# Patient Record
Sex: Male | Born: 1980 | Hispanic: Yes | Marital: Married | Smoking: Current every day smoker
Health system: Southern US, Community
[De-identification: ages and names within clinical notes are randomized; demographics above are authoritative.]

---

## 2016-03-07 ENCOUNTER — Encounter: Payer: Self-pay | Admitting: Emergency Medicine

## 2016-03-07 DIAGNOSIS — F17200 Nicotine dependence, unspecified, uncomplicated: Secondary | ICD-10-CM | POA: Insufficient documentation

## 2016-03-07 DIAGNOSIS — K219 Gastro-esophageal reflux disease without esophagitis: Secondary | ICD-10-CM | POA: Insufficient documentation

## 2016-03-07 LAB — URINALYSIS COMPLETE WITH MICROSCOPIC (ARMC ONLY)
BACTERIA UA: NONE SEEN
Bilirubin Urine: NEGATIVE
GLUCOSE, UA: NEGATIVE mg/dL
Hgb urine dipstick: NEGATIVE
Ketones, ur: NEGATIVE mg/dL
Leukocytes, UA: NEGATIVE
Nitrite: NEGATIVE
PROTEIN: NEGATIVE mg/dL
SQUAMOUS EPITHELIAL / LPF: NONE SEEN
Specific Gravity, Urine: 1.017 (ref 1.005–1.030)
WBC UA: NONE SEEN WBC/hpf (ref 0–5)
pH: 6 (ref 5.0–8.0)

## 2016-03-07 LAB — COMPREHENSIVE METABOLIC PANEL
ALBUMIN: 4.4 g/dL (ref 3.5–5.0)
ALT: 53 U/L (ref 17–63)
AST: 43 U/L — AB (ref 15–41)
Alkaline Phosphatase: 56 U/L (ref 38–126)
Anion gap: 10 (ref 5–15)
BILIRUBIN TOTAL: 0.4 mg/dL (ref 0.3–1.2)
BUN: 12 mg/dL (ref 6–20)
CHLORIDE: 106 mmol/L (ref 101–111)
CO2: 22 mmol/L (ref 22–32)
Calcium: 9 mg/dL (ref 8.9–10.3)
Creatinine, Ser: 0.89 mg/dL (ref 0.61–1.24)
GFR calc Af Amer: >60 mL/min (ref >60)
GFR calc non Af Amer: >60 mL/min (ref >60)
GLUCOSE: 145 mg/dL — AB (ref 65–99)
POTASSIUM: 3.3 mmol/L — AB (ref 3.5–5.1)
SODIUM: 138 mmol/L (ref 135–145)
Total Protein: 7.3 g/dL (ref 6.5–8.1)

## 2016-03-07 LAB — CBC
HEMATOCRIT: 46.7 % (ref 40.0–52.0)
HEMOGLOBIN: 16.5 g/dL (ref 13.0–18.0)
MCH: 33.7 pg (ref 26.0–34.0)
MCHC: 35.3 g/dL (ref 32.0–36.0)
MCV: 95.5 fL (ref 80.0–100.0)
Platelets: 146 10*3/uL — ABNORMAL LOW (ref 150–440)
RBC: 4.89 MIL/uL (ref 4.40–5.90)
RDW: 12.8 % (ref 11.5–14.5)
WBC: 5.8 10*3/uL (ref 3.8–10.6)

## 2016-03-07 LAB — TROPONIN I

## 2016-03-07 LAB — LIPASE, BLOOD: Lipase: 38 U/L (ref 11–51)

## 2016-03-07 NOTE — ED Triage Notes (Signed)
Patient ambulatory to triage with steady gait, without difficulty or distress noted; per [facility] interpreter, pt reports eating a sandwich then began having mid upper abd pain with SHOB and nausea, weakness

## 2016-03-08 ENCOUNTER — Emergency Department: Payer: Self-pay

## 2016-03-08 ENCOUNTER — Emergency Department
Admission: EM | Admit: 2016-03-08 | Discharge: 2016-03-08 | Disposition: A | Discharge status: Home / Self Care | Payer: Self-pay | Attending: Emergency Medicine | Admitting: Emergency Medicine

## 2016-03-08 DIAGNOSIS — R1010 Upper abdominal pain, unspecified: Secondary | ICD-10-CM

## 2016-03-08 DIAGNOSIS — K219 Gastro-esophageal reflux disease without esophagitis: Secondary | ICD-10-CM

## 2016-03-08 MED ORDER — ONDANSETRON 4 MG PO TBDP: ORAL_TABLET | ORAL | 0 refills | Status: AC

## 2016-03-08 MED ORDER — OMEPRAZOLE MAGNESIUM 20 MG PO TBEC: 20.0000 mg | DELAYED_RELEASE_TABLET | Freq: Every day | ORAL | 1 refills | Status: AC

## 2016-03-08 NOTE — ED Notes (Signed)
Interpreter used for discharge. 

## 2016-03-08 NOTE — ED Provider Notes (Signed)
[facility] Emergency Department Provider Note  ____________________________________________   First MD Initiated Contact with Patient 03/08/16 [phone]     (approximate)  I have reviewed the triage vital signs and the nursing notes.   HISTORY  Chief Complaint Abdominal Pain  The patient and/or family speak(s) Spanish.  They understand they have the right to the use of a hospital interpreter, however at this time they prefer to speak directly with me in Spanish.  They know that they can ask for an interpreter at any time.   HPI Philip Khan is a 35 y.o. male who reports no significant past medical history presents for evaluation of upper abdominal pain that occurred after eating.He reports this started acutely and was quite severe and caused him to be short of breath.  He also felt nauseated and flushed.  He did not vomit.  He denies fever/chills, chest pain, and lower abdominal pain.  He states that the same thing happened to him several days ago (he cannot remember exactly how long).  He feels much better now and is no longer having trouble breathing but still has some discomfort in his upper abdomen and it feels like acid going up into his throat.  Nothing particular made it better or worse.   History reviewed. No pertinent past medical history.  There are no active problems to display for this patient.   History reviewed. No pertinent surgical history.  Prior to Admission medications   Medication Sig Start Date End Date Taking? Authorizing Provider  omeprazole (PRILOSEC OTC) 20 MG tablet Take 1 tablet (20 mg total) by mouth daily. 03/08/16 03/08/17  Loleta Roseory Ladena Jacquez, MD  ondansetron (ZOFRAN ODT) 4 MG disintegrating tablet Allow 1-2 tablets to dissolve in your mouth every 8 hours as needed for nausea/vomiting 03/08/16   Loleta Roseory Suriyah Vergara, MD    Allergies Review of patient's allergies indicates no known allergies.  No family history on file.  Social  History Social History  Substance Use Topics  . Smoking status: Current Every Day Smoker  . Smokeless tobacco: Never Used  . Alcohol use Yes    Review of Systems Constitutional: No fever/chills Eyes: No visual changes. ENT: No sore throat. Cardiovascular: Denies chest pain. Respiratory: shortness of breath Due to the upper abdominal pain Gastrointestinal: +abdominal pain.  nausea, no vomiting.  No diarrhea.  No constipation. Genitourinary: Negative for dysuria. Musculoskeletal: Negative for back pain. Skin: Negative for rash. Neurological: Negative for headaches, focal weakness or numbness.  10-point ROS otherwise negative.  ____________________________________________   PHYSICAL EXAM:  VITAL SIGNS: ED Triage Vitals  Enc Vitals Group     BP 03/07/16 2309 (!) 157/96     Pulse Rate 03/07/16 2309 (!) 106     Resp 03/07/16 2309 20     Temp 03/07/16 2309 97.9 F (36.6 C)     Temp Source 03/07/16 2309 Oral     SpO2 03/07/16 2309 100 %     Weight 03/07/16 2309 183 lb (83 kg)     Height --      Head Circumference --      Peak Flow --      Pain Score 03/07/16 2308 5     Pain Loc --      Pain Edu? --      Excl. in GC? --     Constitutional: Alert and oriented. Well appearing and in no acute distress. Eyes: Conjunctivae are normal. PERRL. EOMI. Head: Atraumatic. Nose: No congestion/rhinnorhea. Mouth/Throat: Mucous membranes are moist.  Oropharynx non-erythematous. Neck: No stridor.  No meningeal signs.   Cardiovascular: Normal rate, regular rhythm. Good peripheral circulation. Grossly normal heart sounds.   Respiratory: Normal respiratory effort.  No retractions. Lungs CTAB. Gastrointestinal: Soft With mild tenderness to palpation of the epigastrium and tenderness in the right upper quadrant with positive Murphy sign.  No lower abdominal tenderness. Musculoskeletal: No lower extremity tenderness nor edema. No gross deformities of extremities. Neurologic:  Normal speech  and language. No gross focal neurologic deficits are appreciated.  Skin:  Skin is warm, dry and intact. No rash noted. Psychiatric: Mood and affect are normal. Speech and behavior are normal.  ____________________________________________   LABS (all labs ordered are listed, but only abnormal results are displayed)  Labs Reviewed  COMPREHENSIVE METABOLIC PANEL - Abnormal; Notable for the following:       Result Value   Potassium 3.3 (*)    Glucose, Bld 145 (*)    AST 43 (*)    All other components within normal limits  CBC - Abnormal; Notable for the following:    Platelets 146 (*)    All other components within normal limits  URINALYSIS COMPLETEWITH MICROSCOPIC ([facility] ONLY) - Abnormal; Notable for the following:    Color, Urine YELLOW (*)    APPearance CLEAR (*)    All other components within normal limits  LIPASE, BLOOD  TROPONIN I   ____________________________________________  EKG  ED ECG REPORT I, Zakariye Nee, the attending physician, personally viewed and interpreted this ECG.  Date: 03/07/2016 EKG Time: 23:11 Rate: 100 Rhythm: Sinus tachycardia QRS Axis: normal Intervals: normal ST/T Wave abnormalities: normal Conduction Disturbances: none Narrative Interpretation: unremarkable  ____________________________________________  RADIOLOGY   US Abdomen Limited Ruq  Result Date: 03/08/2016 CLINICAL DATA:  Postprandial epigastric and RIGHT upper quadrant pain. EXAM: US ABDOMEN LIMITED - RIGHT UPPER QUADRANT COMPARISON:  None. FINDINGS: Gallbladder: No gallstones or wall thickening visualized. No sonographic Murphy sign noted by sonographer. Common bile duct: Diameter: 3 mm Liver: Diffusely echogenic with focal fatty sparing about the gallbladder fossa. Hepatopetal portal vein. No intrahepatic biliary dilatation. IMPRESSION: No acute RIGHT upper quadrant process. Hepatic steatosis. Electronically Signed   By: Awilda Metro M.D.   On: 03/08/2016 04:54     ____________________________________________   PROCEDURES  Procedure(s) performed:   Procedures   Critical Care performed: No ____________________________________________   INITIAL IMPRESSION / ASSESSMENT AND PLAN / ED COURSE  Pertinent labs & imaging results that were available during my care of the patient were reviewed by me and considered in my medical decision making (see chart for details).  Very likely to be cardiac or pulmonary.  Acid reflux/GERD versus ileocolic is most likely.  Labs unremarkable and vital signs are normal, very mild tachycardia has resolved.  Patient is in no acute distress.  We will evaluate with ultrasound and reassess.  Clinical Course  Value Comment By Time  US Abdomen Limited RUQ Ultrasound is unremarkable with no evidence of gallbladder disease.  Patient is likely suffering from acid reflux.  I will recommend over-the-counter medications and outpatient follow-up.  I gave my usual and customary return precautions. Loleta Rose, MD 08/08 0510    ____________________________________________  FINAL CLINICAL IMPRESSION(S) / ED DIAGNOSES  Final diagnoses:  Pain of upper abdomen  Gastroesophageal reflux disease, esophagitis presence not specified     MEDICATIONS GIVEN DURING THIS VISIT:  Medications - No data to display   NEW OUTPATIENT MEDICATIONS STARTED DURING THIS VISIT:  Discharge Medication List as of 03/08/2016  5:32  AM    START taking these medications   Details  omeprazole (PRILOSEC OTC) 20 MG tablet Take 1 tablet (20 mg total) by mouth daily., Starting Tue 03/08/2016, Until Wed 03/08/2017, Print    ondansetron (ZOFRAN ODT) 4 MG disintegrating tablet Allow 1-2 tablets to dissolve in your mouth every 8 hours as needed for nausea/vomiting, Print          Note:  This document was prepared using Dragon voice recognition software and may include unintentional dictation errors.    Loleta Rose, MD 03/08/16 [phone]

## 2016-03-08 NOTE — ED Notes (Signed)
Pt oriented to room and call light.  Denies pain at this time; instead reports discomfort epigastric up through esophagus.  Denies nausea.

## 2018-08-03 IMAGING — US US ABDOMEN LIMITED
1 series · 14 of 25 positions shown · non-contrast
Comparison: None.

CLINICAL DATA: Postprandial epigastric and RIGHT upper quadrant
pain.

EXAM:
US ABDOMEN LIMITED - RIGHT UPPER QUADRANT

[Series 1: us abdomen limited · 0.20mm/px · 14 of 42 slices shown]
[im 1/42]
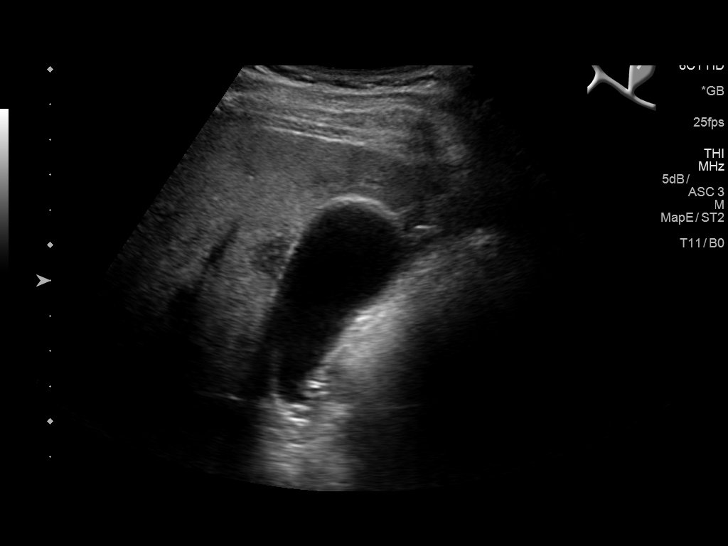
[im 4/42]
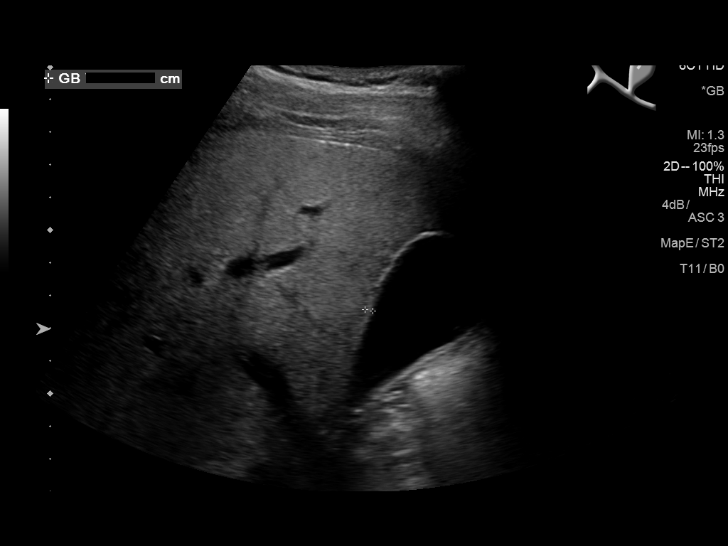
[im 7/42]
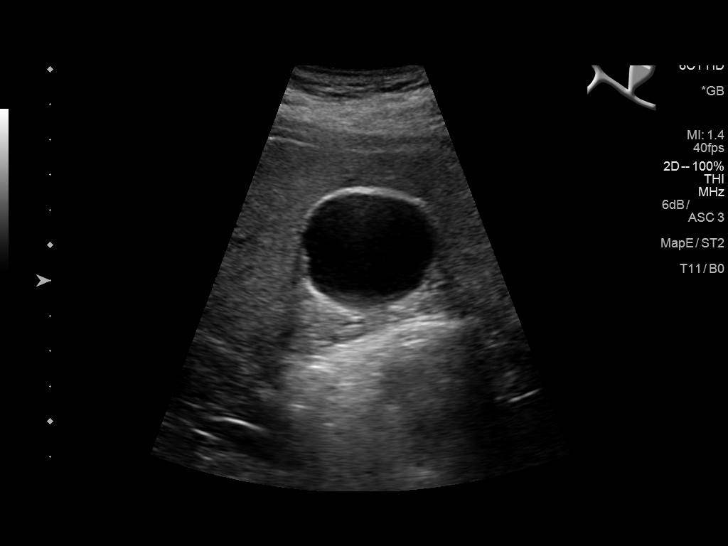
[im 11/42]
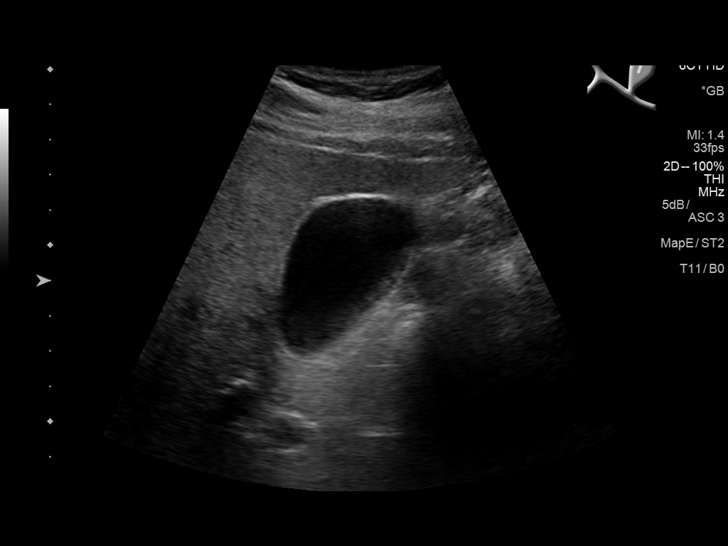
[im 14/42]
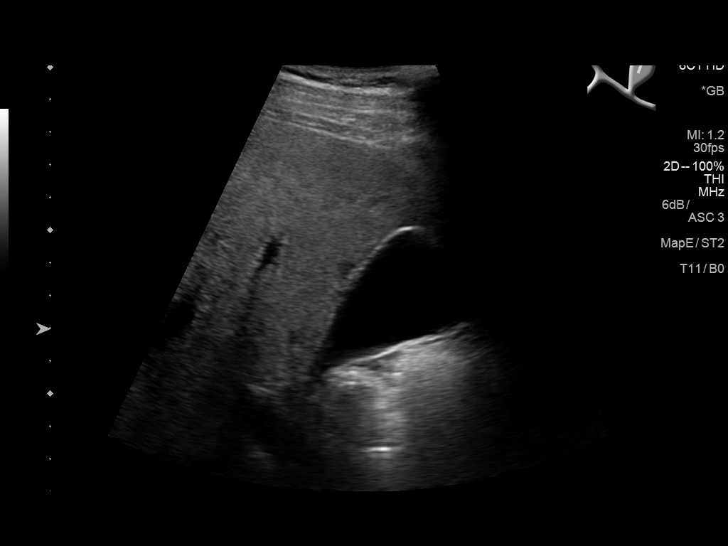
[im 16/42]
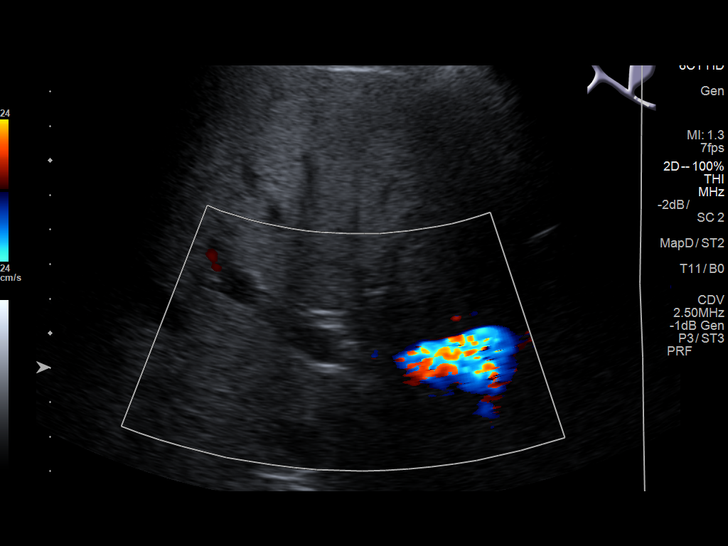
[im 19/42]
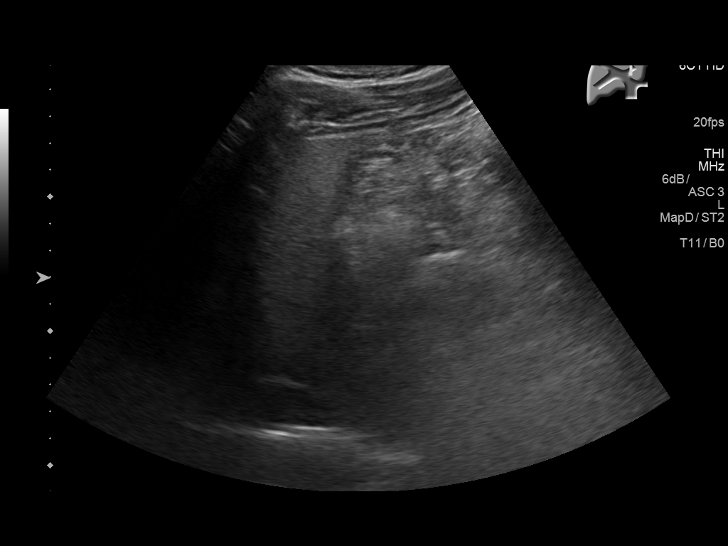
[im 23/42]
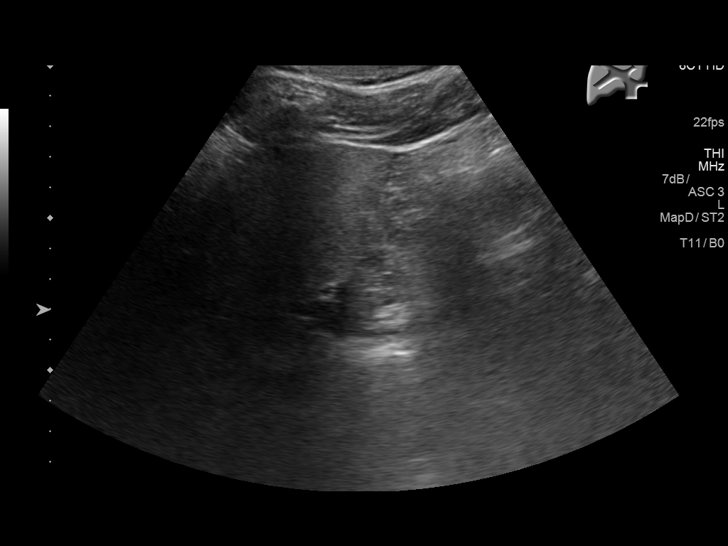
[im 26/42]
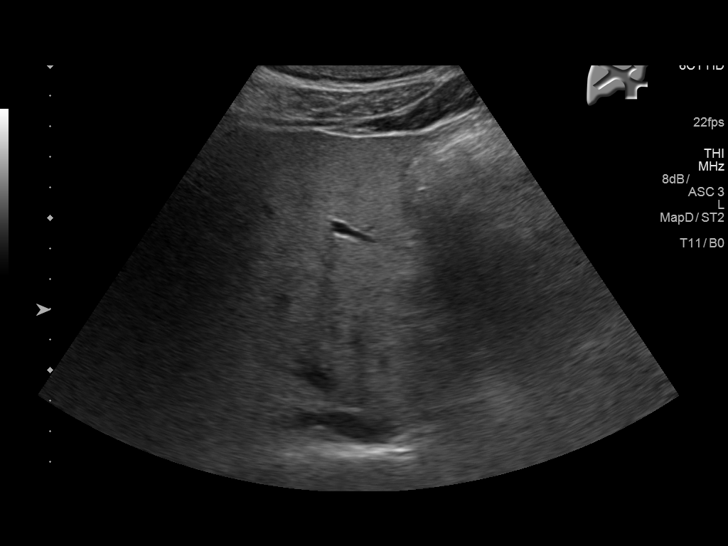
[im 28/42]
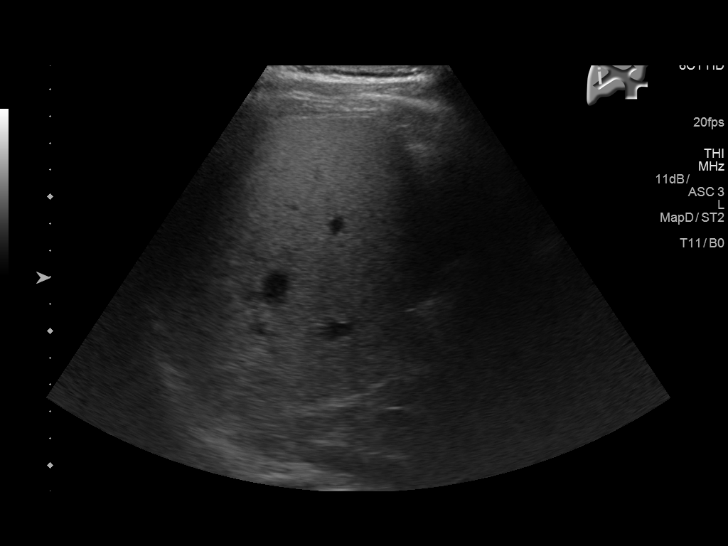
[im 31/42]
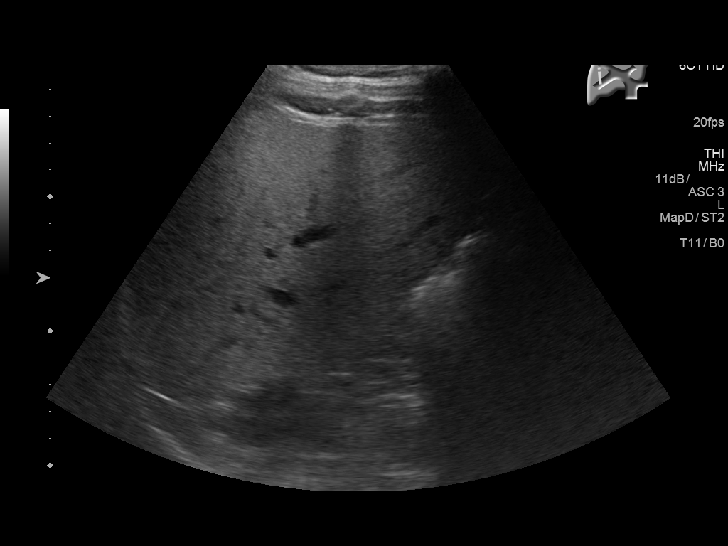
[im 35/42]
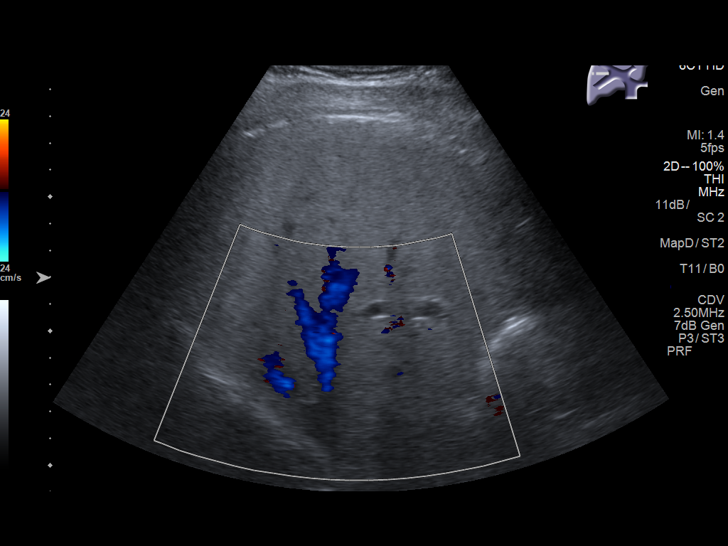
[im 38/42]
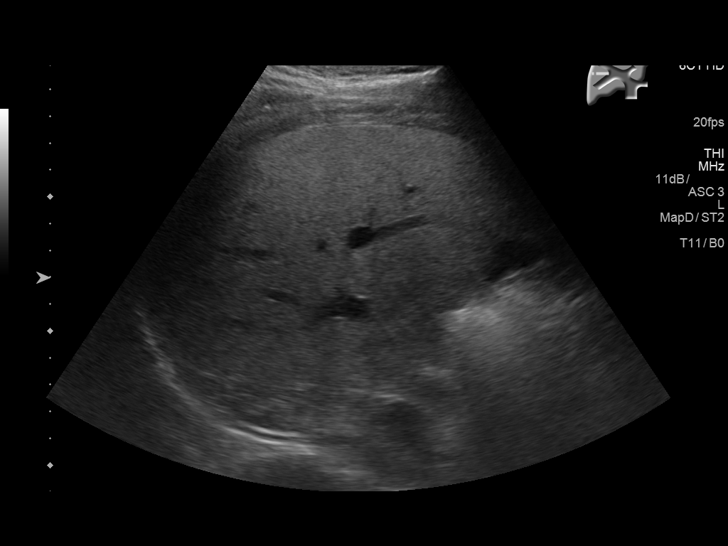
[im 42/42]
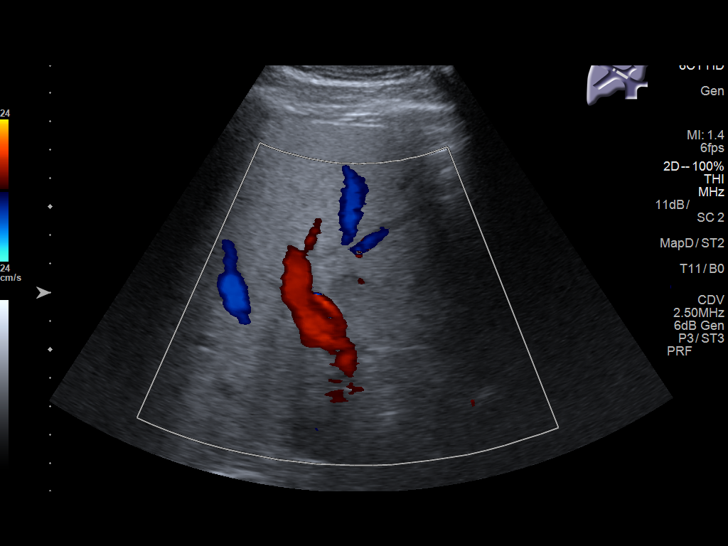

[14 of 25 positions shown; findings below may reference images not displayed]

FINDINGS: Gallbladder:

No gallstones or wall thickening visualized. No sonographic Murphy
sign noted by sonographer.

Common bile duct:

Diameter: 3 mm

Liver:

Diffusely echogenic with focal fatty sparing about the gallbladder
fossa. Hepatopetal portal vein. No intrahepatic biliary dilatation.
IMPRESSION: No acute RIGHT upper quadrant process.

Hepatic steatosis.
